# Patient Record
Sex: Male | Born: 2005 | Race: White | Hispanic: No | Marital: Single | Smoking: Never smoker
Health system: Southern US, Community
[De-identification: ages and names within clinical notes are randomized; demographics above are authoritative.]

## PROBLEM LIST (undated history)

## (undated) DIAGNOSIS — J45909 Unspecified asthma, uncomplicated: Secondary | ICD-10-CM

---
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
## 2023-01-07 ENCOUNTER — Telehealth: Payer: Self-pay | Admitting: Family Medicine

## 2023-01-07 NOTE — Telephone Encounter (Signed)
   CLINICAL USE BELOW THIS LINE (use X to signify action taken)  _X__ Form received and placed in providers office for signature. ___ Form completed and faxed to LOA Dept.  ___ Form completed & LVM to notify patient ready for pick up.  ___ Charge sheet and copy of form in front office folder for office supervisor.  

## 2023-01-07 NOTE — Telephone Encounter (Signed)
Type of forms received:HIPAA,HEALTH CARE POA,LIVING WELL  Routed to: [employer] received by : terrill   Individual made aware of 3-5 business day turn around (Y/N): Yes  Form completed and patient made aware of charges(Y/N): yes  Faxed to :   Form location:  dr Grandville Silos boc

## 2023-01-08 NOTE — Telephone Encounter (Signed)
Placed in scan folder.

## 2023-01-14 ENCOUNTER — Other Ambulatory Visit: Payer: Self-pay | Admitting: Family Medicine

## 2023-01-14 DIAGNOSIS — J4520 Mild intermittent asthma, uncomplicated: Secondary | ICD-10-CM

## 2023-01-14 NOTE — Telephone Encounter (Signed)
Chart supports rx. Last OV: 06/07/2022

## 2023-02-05 ENCOUNTER — Ambulatory Visit (INDEPENDENT_AMBULATORY_CARE_PROVIDER_SITE_OTHER): Payer: BC Managed Care – PPO | Admitting: Family Medicine

## 2023-02-05 ENCOUNTER — Encounter: Payer: Self-pay | Admitting: Family Medicine

## 2023-02-05 VITALS — BP 122/76 | HR 70 | Temp 97.6°F | Ht 70.0 in | Wt 133.0 lb

## 2023-02-05 DIAGNOSIS — Z8249 Family history of ischemic heart disease and other diseases of the circulatory system: Secondary | ICD-10-CM

## 2023-02-05 DIAGNOSIS — J4520 Mild intermittent asthma, uncomplicated: Secondary | ICD-10-CM

## 2023-02-05 DIAGNOSIS — Z130 Encounter for screening for diseases of the blood and blood-forming organs and certain disorders involving the immune mechanism: Secondary | ICD-10-CM

## 2023-02-05 DIAGNOSIS — Z0000 Encounter for general adult medical examination without abnormal findings: Secondary | ICD-10-CM

## 2023-02-05 DIAGNOSIS — Z1321 Encounter for screening for nutritional disorder: Secondary | ICD-10-CM

## 2023-02-05 DIAGNOSIS — Z025 Encounter for examination for participation in sport: Secondary | ICD-10-CM | POA: Diagnosis not present

## 2023-02-05 DIAGNOSIS — Z13228 Encounter for screening for other metabolic disorders: Secondary | ICD-10-CM

## 2023-02-05 DIAGNOSIS — I517 Cardiomegaly: Secondary | ICD-10-CM

## 2023-02-05 DIAGNOSIS — Z13220 Encounter for screening for lipoid disorders: Secondary | ICD-10-CM | POA: Diagnosis not present

## 2023-02-05 DIAGNOSIS — Z23 Encounter for immunization: Secondary | ICD-10-CM | POA: Diagnosis not present

## 2023-02-05 DIAGNOSIS — Z1159 Encounter for screening for other viral diseases: Secondary | ICD-10-CM

## 2023-02-05 DIAGNOSIS — Z1329 Encounter for screening for other suspected endocrine disorder: Secondary | ICD-10-CM

## 2023-02-05 LAB — BASIC METABOLIC PANEL
BUN: 12 mg/dL (ref 6–23)
CO2: 30 mEq/L (ref 19–32)
Calcium: 9.7 mg/dL (ref 8.4–10.5)
Chloride: 103 mEq/L (ref 96–112)
Creatinine, Ser: 0.92 mg/dL (ref 0.40–1.50)
GFR: 121.65 mL/min (ref >60.00)
Glucose, Bld: 83 mg/dL (ref 70–99)
Potassium: 4.2 mEq/L (ref 3.5–5.1)
Sodium: 138 mEq/L (ref 135–145)

## 2023-02-05 NOTE — Assessment & Plan Note (Signed)
Patient has a history of asthma, reporting infrequent episodes requiring albuterol inhaler usage.  Plan:  Continue albuterol inhaler as needed. Consider an updated asthma action plan. Encourage avoidance of known triggers such as dust. Review inhaler technique to ensure efficacy.

## 2023-02-05 NOTE — Patient Instructions (Addendum)
Your EKG was normal.   You may play baseball without restrictions. Please come back if you develop chest pain, shortness of breath, or other symptoms. If severe, go to ED.

## 2023-02-05 NOTE — Assessment & Plan Note (Signed)
Family history notable for father's heart attack near the age of 29 raises concern for potential increased cardiac risk.  Plan:  Perform baseline EKG to assess for any cardiac abnormalities.  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 Obtain lipid profile to screen for dyslipidemia. Follow-up on lab results and consider cardiology referral if abnormalities detected. Counseled provided on return and ED for symptoms including chest pain, shortness of breath, or other abnormal symptoms.

## 2023-02-05 NOTE — Progress Notes (Signed)
Assessment  Assessment/Plan:   Problem List Items Addressed This Visit       Cardiovascular and Mediastinum   Athletic heart syndrome     Respiratory   Mild intermittent asthma    Patient has a history of asthma, reporting infrequent episodes requiring albuterol inhaler usage.  Plan:  Continue albuterol inhaler as needed. Consider an updated asthma action plan. Encourage avoidance of known triggers such as dust. Review inhaler technique to ensure efficacy.        Other   Screening for viral disease   Relevant Orders   Hepatitis C Antibody   HIV antibody (with reflex)   Family history of heart disease - Primary    Family history notable for father's heart attack near the age of 21 raises concern for potential increased cardiac risk.  Plan:  Perform baseline EKG to assess for any cardiac abnormalities.  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 Obtain lipid profile to screen for dyslipidemia. Follow-up on lab results and consider cardiology referral if abnormalities detected. Counseled provided on return and ED for symptoms including chest pain, shortness of breath, or other abnormal symptoms.      Relevant Orders   EKG 12-Lead (Completed)   Basic Metabolic Panel (BMET)   Routine sports physical exam    Cleared to play sports given normal EKG.  Form provided patient      Relevant Orders   EKG 12-Lead (Completed)   Basic Metabolic Panel (BMET)   Other Visit Diagnoses     Encounter for well adult exam without abnormal findings       Relevant Orders   EKG 12-Lead (Completed)   Basic Metabolic Panel (BMET)   Screening, lipid       Screening for endocrine, nutritional, metabolic and immunity disorder       Relevant Orders   Basic Metabolic Panel (BMET)   Need for Tdap vaccination       Relevant Orders   Tdap vaccine greater than or equal to 7yo IM (Completed)   Need for vaccination against human papillomavirus       Relevant Orders    HPV 9-valent vaccine,Recombinat (Completed)       There are no discontinued medications.  Patient Counseling(The following topics were reviewed and/or handout was given):  -Nutrition: Stressed importance of moderation in sodium/caffeine intake, saturated fat and cholesterol, caloric balance, sufficient intake of fresh fruits, vegetables, and fiber.  -Stressed the importance of regular exercise.   -Substance Abuse: Discussed cessation/primary prevention of tobacco, alcohol, or other drug use; driving or other dangerous activities under the influence; availability of treatment for abuse.   -Injury prevention: Discussed safety belts, safety helmets, smoke detector, smoking near bedding or upholstery.   -Sexuality: Discussed sexually transmitted diseases, partner selection, use of condoms, avoidance of unintended pregnancy and contraceptive alternatives.   -Dental health: Discussed importance of regular tooth brushing, flossing, and dental visits.  -Health maintenance and immunizations reviewed. Please refer to Health maintenance section.  Return to care in 1 year for next preventative visit.       Subjective:  Chief complaint Encounter date: 02/05/2023  Chief Complaint  Patient presents with   Annual Exam   Shawn Salas is a 17 y.o. male who presents today for his annual comprehensive physical exam.    Patient is a 17 y.o. year old male here for sports physical.  CHIEF COMPLAINT: 17 year old male presents for a sports physical, no current complaints.  HISTORY OF PRESENT ILLNESS:  Sports  Physical. Patient plans to play baseball as shortstop. Denies chest pain, shortness of breath, syncope, or passing out with exercise.  No medical problems.  Patient has a family history of father who is had nonfatal heart attack around the age of 39.    Cough. The patient reports occasional cough which may be associated with a known asthma condition, triggered by dust or indoor exercise. He  mentions usage of albuterol inhaler once every month or two months.  Symptoms are well-controlled with this.  ROS:  Cardiology: No chest pain or shortness of breath. Pulmonary: Infrequent use of albuterol inhaler for asthma; no other respiratory complaints. Neurological: No syncope or neurological deficits noted. Remainder of ROS negative.    Past Medical History:  Diagnosis Date   Asthma     Current Outpatient Medications on File Prior to Visit  Medication Sig Dispense Refill   albuterol (VENTOLIN HFA) 108 (90 Base) MCG/ACT inhaler INHALE 1-2 PUFFS BY MOUTH EVERY 6 HOURS AS NEEDED FOR WHEEZE OR SHORTNESS OF BREATH 18 each 0   No current facility-administered medications on file prior to visit.    Past Surgical History:  Procedure Laterality Date   FINGER SURGERY  2020    Allergies  Allergen Reactions   Peanut-Containing Drug Products Rash    Social History   Socioeconomic History   Marital status: Single    Spouse name: Not on file   Number of children: Not on file   Years of education: Not on file   Highest education level: Not on file  Occupational History   Not on file  Tobacco Use   Smoking status: Never   Smokeless tobacco: Not on file  Vaping Use   Vaping Use: Never used  Substance and Sexual Activity   Alcohol use: No   Drug use: No   Sexual activity: Not on file  Other Topics Concern   Not on file  Social History Narrative   Not on file   Social Determinants of Health   Financial Resource Strain: Not on file  Food Insecurity: Not on file  Transportation Needs: Not on file  Physical Activity: Not on file  Stress: Not on file  Social Connections: Not on file  Intimate Partner Violence: Not on file    Family History  Problem Relation Age of Onset   Heart attack Father    Diabetes Paternal Grandmother     BP 122/76 (BP Location: Right Arm, Patient Position: Sitting, Cuff Size: Large)   Pulse 70   Temp 97.6 F (36.4 C) (Temporal)   Ht  '5\' 10"'$  (1.778 m)   Wt 133 lb (60.3 kg)   SpO2 97%   BMI 19.08 kg/m      06/07/2022    9:43 AM  GAD-7 Generalized Anxiety Disorder Screening Tool  1. Feeling Nervous, Anxious, or on Edge 0  2. Not Being Able to Stop or Control Worrying 0  3. Worrying Too Much About Different Things 0  4. Trouble Relaxing 0  5. Being So Restless it's Hard To Sit Still 0  6. Becoming Easily Annoyed or Irritable 0  7. Feeling Afraid As If Something Awful Might Happen 0  Total GAD-7 Score 0  Difficulty At Work, Home, or Getting  Along With Others? Not difficult at all      06/07/2022    9:43 AM  Depression screen PHQ 2/9  Decreased Interest 0  Down, Depressed, Hopeless 0  PHQ - 2 Score 0  Altered sleeping 0  Tired, decreased  energy 0  Change in appetite 0  Feeling bad or failure about yourself  3  Trouble concentrating 0  Moving slowly or fidgety/restless 0  Suicidal thoughts 0  PHQ-9 Score 3  Difficult doing work/chores Not difficult at all    Health Maintenance Due  Topic Date Due   HIV Screening  Never done   INFLUENZA VACCINE  07/10/2022   COVID-19 Vaccine (4 - 2023-24 season) 08/10/2022   Hepatitis C Screening  Never done   HPV VACCINES (2 - Male 3-dose series) 03/05/2023     PMH:  The following were reviewed and entered/updated in epic: Past Medical History:  Diagnosis Date   Asthma     Patient Active Problem List   Diagnosis Date Noted   Athletic heart syndrome 02/05/2023   Screening for viral disease 02/05/2023   Family history of heart disease 02/05/2023   Routine sports physical exam 02/05/2023   Peanut allergy 06/07/2022   Lactose intolerance 06/07/2022   Mild intermittent asthma 06/07/2022   Seasonal allergies 06/07/2022   History of nicotine vaping 06/07/2022    Past Surgical History:  Procedure Laterality Date   FINGER SURGERY  2020    Family History  Problem Relation Age of Onset   Heart attack Father    Diabetes Paternal Grandmother      Medications- reviewed and updated Outpatient Medications Prior to Visit  Medication Sig Dispense Refill   albuterol (VENTOLIN HFA) 108 (90 Base) MCG/ACT inhaler INHALE 1-2 PUFFS BY MOUTH EVERY 6 HOURS AS NEEDED FOR WHEEZE OR SHORTNESS OF BREATH 18 each 0   No facility-administered medications prior to visit.    Allergies  Allergen Reactions   Peanut-Containing Drug Products Rash    Social History   Socioeconomic History   Marital status: Single    Spouse name: Not on file   Number of children: Not on file   Years of education: Not on file   Highest education level: Not on file  Occupational History   Not on file  Tobacco Use   Smoking status: Never   Smokeless tobacco: Not on file  Vaping Use   Vaping Use: Never used  Substance and Sexual Activity   Alcohol use: No   Drug use: No   Sexual activity: Not on file  Other Topics Concern   Not on file  Social History Narrative   Not on file   Social Determinants of Health   Financial Resource Strain: Not on file  Food Insecurity: Not on file  Transportation Needs: Not on file  Physical Activity: Not on file  Stress: Not on file  Social Connections: Not on file        Objective:  Physical Exam: BP 122/76 (BP Location: Right Arm, Patient Position: Sitting, Cuff Size: Large)   Pulse 70   Temp 97.6 F (36.4 C) (Temporal)   Ht '5\' 10"'$  (1.778 m)   Wt 133 lb (60.3 kg)   SpO2 97%   BMI 19.08 kg/m   Body mass index is 19.08 kg/m. Wt Readings from Last 3 Encounters:  02/05/23 133 lb (60.3 kg) (22 %, Z= -0.77)*  06/07/22 131 lb (59.4 kg) (24 %, Z= -0.71)*  07/19/19 125 lb 6 oz (56.9 kg) (58 %, Z= 0.20)*   * Growth percentiles are based on CDC (Boys, 2-20 Years) data.   Vision Screening   Right eye Left eye Both eyes  Without correction '20/20 20/20 20/20 '$  With correction      Physical Exam Constitutional:  Appearance: Normal appearance. He is normal weight.     Comments: Non-marfanoid  HENT:      Head: Normocephalic and atraumatic.     Nose: Nose normal.     Mouth/Throat:     Mouth: Mucous membranes are moist.     Pharynx: Oropharynx is clear.  Eyes:     Extraocular Movements: Extraocular movements intact.     Conjunctiva/sclera: Conjunctivae normal.     Pupils: Pupils are equal, round, and reactive to light.  Cardiovascular:     Rate and Rhythm: Regular rhythm. Bradycardia present.     Heart sounds: Normal heart sounds. No murmur heard.    No gallop.  Pulmonary:     Effort: Pulmonary effort is normal.     Breath sounds: Normal breath sounds. No wheezing.  Abdominal:     General: Abdomen is flat.     Palpations: Abdomen is soft.  Musculoskeletal:        General: Normal range of motion.     Right shoulder: Normal.     Left shoulder: Normal.     Right upper arm: Normal.     Left upper arm: Normal.     Right elbow: Normal.     Left elbow: Normal.     Right forearm: Normal.     Left forearm: Normal.     Right wrist: Normal.     Left wrist: Normal.     Right hand: Normal.     Left hand: Normal.     Cervical back: Normal, normal range of motion and neck supple.     Thoracic back: Normal.     Lumbar back: Normal.     Right upper leg: Normal.     Left upper leg: Normal.     Right knee: Normal.     Left knee: Normal.     Right lower leg: Normal.     Left lower leg: Normal.     Right ankle: Normal.     Right Achilles Tendon: Normal.     Left ankle: Normal.     Left Achilles Tendon: Normal.     Right foot: Normal.     Left foot: Normal.  Skin:    General: Skin is warm and dry.  Neurological:     Mental Status: He is alert.     Cranial Nerves: No cranial nerve deficit.     Sensory: No sensory deficit.     Motor: Motor function is intact.     Gait: Gait normal.     Deep Tendon Reflexes: Reflexes normal.     Reflex Scores:      Patellar reflexes are 2+ on the right side and 2+ on the left side. Psychiatric:        Mood and Affect: Mood normal.        Behavior:  Behavior normal.        Thought Content: Thought content normal.        Judgment: Judgment normal.         At today's visit, we discussed treatment options, associated risk and benefits, and engage in counseling as needed.  Additionally the following were reviewed: Past medical records, past medical and surgical history, family and social background, as well as relevant laboratory results, imaging findings, and specialty notes, where applicable.  This message was generated using dictation software, and as a result, it may contain unintentional typos or errors.  Nevertheless, extensive effort was made to accurately convey at the pertinent aspects of the patient visit.  There may have been are other unrelated non-urgent complaints, but due to the busy schedule and the amount of time already spent with him, time does not permit to address these issues at today's visit. Another appointment may have or has been requested to review these additional issues.   Marny Lowenstein, MD, MS

## 2023-02-05 NOTE — Assessment & Plan Note (Signed)
Cleared to play sports given normal EKG.  Form provided patient

## 2023-02-06 LAB — HEPATITIS C ANTIBODY: Hepatitis C Ab: NONREACTIVE

## 2023-02-06 LAB — HIV ANTIBODY (ROUTINE TESTING W REFLEX): HIV 1&2 Ab, 4th Generation: NONREACTIVE

## 2023-03-04 NOTE — Progress Notes (Signed)
After obtaining consent, and per orders of Dr. Grandville Silos, injection of HPV given IM in the LT deltoid and Bexsero given IM in the Rt deltoid by Langley Gauss. M, CMA. Patient instructed to remain in clinic for 20 minutes afterwards, and to report any adverse reaction to me immediately. Copy of immunization report given to patient.

## 2023-03-07 ENCOUNTER — Ambulatory Visit: Payer: BC Managed Care – PPO

## 2023-03-07 DIAGNOSIS — Z23 Encounter for immunization: Secondary | ICD-10-CM

## 2023-05-27 ENCOUNTER — Other Ambulatory Visit: Payer: Self-pay | Admitting: Family Medicine

## 2023-05-27 DIAGNOSIS — J4520 Mild intermittent asthma, uncomplicated: Secondary | ICD-10-CM

## 2023-06-27 ENCOUNTER — Other Ambulatory Visit: Payer: Self-pay | Admitting: Family Medicine

## 2023-06-27 DIAGNOSIS — J4520 Mild intermittent asthma, uncomplicated: Secondary | ICD-10-CM

## 2023-07-11 ENCOUNTER — Other Ambulatory Visit: Payer: Self-pay | Admitting: Family Medicine

## 2023-07-11 ENCOUNTER — Telehealth: Payer: Self-pay | Admitting: Family Medicine

## 2023-07-11 DIAGNOSIS — J4520 Mild intermittent asthma, uncomplicated: Secondary | ICD-10-CM

## 2023-07-11 MED ORDER — ALBUTEROL SULFATE HFA 108 (90 BASE) MCG/ACT IN AERS: 1.0000 | INHALATION_SPRAY | Freq: Four times a day (QID) | RESPIRATORY_TRACT | 0 refills | Status: DC | PRN

## 2023-07-11 NOTE — Telephone Encounter (Signed)
Pt has used his inhaler more than usual due to work outside. The pharmacy told him it was denied. He needs a refill. Please advise.

## 2023-07-11 NOTE — Telephone Encounter (Signed)
Requesting: Albuterol Inh 108 MCG Last Visit: 02/05/2023 Next Visit: Visit date not found Last Refill: 06/27/2023  Please Advise

## 2023-07-11 NOTE — Addendum Note (Signed)
Addended by: Andrez Grime on: 07/11/2023 03:12 PM   Modules accepted: Orders

## 2023-07-12 NOTE — Telephone Encounter (Signed)
Patient refill for Albuterol was refilled and sent to his pharmacy. Patient is aware.

## 2023-07-16 ENCOUNTER — Other Ambulatory Visit: Payer: Self-pay | Admitting: Family Medicine

## 2023-07-16 DIAGNOSIS — J4520 Mild intermittent asthma, uncomplicated: Secondary | ICD-10-CM

## 2023-08-15 ENCOUNTER — Emergency Department (HOSPITAL_BASED_OUTPATIENT_CLINIC_OR_DEPARTMENT_OTHER)
Admission: EM | Admit: 2023-08-15 | Discharge: 2023-08-15 | Disposition: A | Discharge status: Home / Self Care | Payer: BC Managed Care – PPO | Attending: Emergency Medicine | Admitting: Emergency Medicine

## 2023-08-15 ENCOUNTER — Emergency Department (HOSPITAL_BASED_OUTPATIENT_CLINIC_OR_DEPARTMENT_OTHER): Payer: BC Managed Care – PPO

## 2023-08-15 ENCOUNTER — Encounter (HOSPITAL_BASED_OUTPATIENT_CLINIC_OR_DEPARTMENT_OTHER): Payer: Self-pay

## 2023-08-15 ENCOUNTER — Other Ambulatory Visit: Payer: Self-pay

## 2023-08-15 DIAGNOSIS — S63257A Unspecified dislocation of left little finger, initial encounter: Secondary | ICD-10-CM | POA: Insufficient documentation

## 2023-08-15 DIAGNOSIS — X58XXXA Exposure to other specified factors, initial encounter: Secondary | ICD-10-CM | POA: Diagnosis not present

## 2023-08-15 DIAGNOSIS — Z91010 Allergy to peanuts: Secondary | ICD-10-CM | POA: Diagnosis not present

## 2023-08-15 DIAGNOSIS — S62647A Nondisplaced fracture of proximal phalanx of left little finger, initial encounter for closed fracture: Secondary | ICD-10-CM | POA: Insufficient documentation

## 2023-08-15 DIAGNOSIS — S62607A Fracture of unspecified phalanx of left little finger, initial encounter for closed fracture: Secondary | ICD-10-CM

## 2023-08-15 DIAGNOSIS — Y9367 Activity, basketball: Secondary | ICD-10-CM | POA: Insufficient documentation

## 2023-08-15 DIAGNOSIS — S63259A Unspecified dislocation of unspecified finger, initial encounter: Secondary | ICD-10-CM

## 2023-08-15 DIAGNOSIS — S6992XA Unspecified injury of left wrist, hand and finger(s), initial encounter: Secondary | ICD-10-CM | POA: Diagnosis present

## 2023-08-15 MED ORDER — ACETAMINOPHEN 325 MG PO TABS
650.0000 mg | ORAL_TABLET | Freq: Once | ORAL | Status: AC
  Administered 2023-08-15: 650 mg via ORAL
  Filled 2023-08-15: qty 2

## 2023-08-15 MED ORDER — OXYCODONE-ACETAMINOPHEN 5-325 MG PO TABS
1.0000 | ORAL_TABLET | Freq: Four times a day (QID) | ORAL | Status: DC | PRN
  Administered 2023-08-15: 1 via ORAL
  Filled 2023-08-15: qty 1

## 2023-08-15 NOTE — ED Triage Notes (Signed)
Pt has either fractured or dislocated his left pinky finger while playing basketball. Pt did take Advil PTA

## 2023-08-15 NOTE — Discharge Instructions (Addendum)
Keep finger in splint but okay to take off to shower.  Overall you have a dislocation and fracture of your pinky finger.  Finger is no longer dislocated but there is a small fracture.  Follow-up with hand doctor for further care.  Recommend Tylenol, ibuprofen and ice for pain.  Do not play basketball any sports or do any vigorous activity until you are cleared by doctor.

## 2023-08-15 NOTE — ED Provider Notes (Signed)
[place] [facility] Provider Note   CSN: [id number] Arrival date & time: 08/15/23  2101     History  Chief Complaint  Patient presents with   Finger Injury    Shawn Salas is a 17 y.o. male.  Patient here with injury to the left pinky finger.  He was playing basketball injured the finger.  Deformity.  Nothing makes it worse or better.  Denies any weakness numbness tingling.  No other injury elsewhere.  The history is provided by the patient.       Home Medications Prior to Admission medications   Medication Sig Start Date End Date Taking? Authorizing Provider  albuterol (VENTOLIN HFA) 108 (90 Base) MCG/ACT inhaler Inhale 1-2 puffs into the lungs every 6 (six) hours as needed for wheezing or shortness of breath. 07/11/23   Mliss Sax, MD      Allergies    Peanut-containing drug products    Review of Systems   Review of Systems  Physical Exam Updated Vital Signs BP 123/80   Pulse 67   Temp 98.6 F (37 C)   Resp 20   Ht 5\' 11"  (1.803 m)   Wt 61.2 kg   SpO2 100%   BMI 18.83 kg/m  Physical Exam Constitutional:      General: He is not in acute distress.    Appearance: He is not ill-appearing.  Cardiovascular:     Pulses: Normal pulses.     Heart sounds: Normal heart sounds.  Musculoskeletal:        General: Swelling, tenderness and deformity present.     Cervical back: Normal range of motion.     Comments: Deformity to the left pinky finger, suspect dislocation  Skin:    General: Skin is warm.     Capillary Refill: Capillary refill takes less than 2 seconds.  Neurological:     General: No focal deficit present.     Mental Status: He is alert.     Sensory: No sensory deficit.     Motor: No weakness.     ED Results / Procedures / Treatments   Labs (all labs ordered are listed, but only abnormal results are displayed) Labs Reviewed - No data to display  EKG None  Radiology DG Finger Little  Left  Result Date: 08/15/2023 CLINICAL DATA:  Post reduction EXAM: LEFT FINGER(S) - 2+ VIEW COMPARISON:  08/15/2023 FINDINGS: Reduction of previously noted fifth PIP joint dislocation, now with normal alignment. Acute nondisplaced intra-articular fracture involving the base of the fifth middle phalanx. Positive for soft tissue swelling IMPRESSION: Reduction of previously noted fifth PIP joint dislocation. Acute nondisplaced intra-articular fracture involving the base of the fifth middle phalanx. Electronically Signed   By: Jasmine Pang M.D.   On: 08/15/2023 22:19   DG Finger Little Left  Result Date: 08/15/2023 CLINICAL DATA:  Basketball injury EXAM: LEFT FINGER(S) - 2+ VIEW COMPARISON:  None Available. FINDINGS: Dislocation at the fifth PIP joint with middle phalanx displaced dorsal and ulnar with respect to the head of the proximal phalanx. Possible tiny fracture fragment adjacent to the head of the fifth proximal phalanx and base of the middle phalanx. IMPRESSION: Dislocation at the fifth PIP joint with possible tiny fracture fragments adjacent to the head of fifth proximal phalanx and base of the middle phalanx Electronically Signed   By: Jasmine Pang M.D.   On: 08/15/2023 22:18    Procedures .Ortho Injury Treatment  Date/Time: 08/15/2023 10:37 PM  Performed  by: Virgina Norfolk, DO Authorized by: Virgina Norfolk, DO   Consent:    Consent obtained:  Verbal   Consent given by:  Patient   Risks discussed:  Fracture, irreducible dislocation, nerve damage, recurrent dislocation, vascular damage, restricted joint movement and stiffness   Alternatives discussed:  No treatmentInjury location: left pink finger dislocation. Pre-procedure neurovascular assessment: neurovascularly intact Pre-procedure distal perfusion: normal Pre-procedure neurological function: normal Pre-procedure range of motion: reduced  Anesthesia: Local anesthesia used: no Immobilization: splint Splint type: static  finger Splint Applied by: Milon Dikes Post-procedure neurovascular assessment: post-procedure neurovascularly intact Post-procedure distal perfusion: normal Post-procedure neurological function: normal Post-procedure range of motion: normal       Medications Ordered in ED Medications  oxyCODONE-acetaminophen (PERCOCET/ROXICET) 5-325 MG per tablet 1 tablet (1 tablet Oral Given 08/15/23 2139)  acetaminophen (TYLENOL) tablet 650 mg (650 mg Oral Given 08/15/23 2239)    ED Course/ Medical Decision Making/ A&P                                 Medical Decision Making Amount and/or Complexity of Data Reviewed Radiology: ordered.  Risk OTC drugs. Prescription drug management.   Shawn Salas is here with left pinky injury while playing basketball.  Neurovascular neuromuscular intact.  No significant medical problems.  Normal vitals.  X-ray shows dislocation of the fifth PIP joint with possible tiny fracture fragments adjacent to the head of the proximal phalanx and the base of the middle phalanx.  Patient is given Norco and I was able to reduce dislocation manually without any issues.  Repeat x-ray does show reduction of dislocation and does confirm acute nondisplaced intra-articular fracture involving the base of the fifth middle phalanx.  Patient was placed in a static splint.  Recommend Tylenol, ibuprofen, ice.  Will have him follow-up with hand.  Discharged in good condition.  This chart was dictated using voice recognition software.  Despite best efforts to proofread,  errors can occur which can change the documentation meaning.         Final Clinical Impression(s) / ED Diagnoses Final diagnoses:  Closed nondisplaced fracture of phalanx of left little finger, unspecified phalanx, initial encounter  Dislocation of finger, initial encounter    Rx / DC Orders ED Discharge Orders     None         Virgina Norfolk, DO 08/15/23 2240

## 2023-09-09 ENCOUNTER — Telehealth: Payer: Self-pay | Admitting: Family Medicine

## 2023-09-09 DIAGNOSIS — J4520 Mild intermittent asthma, uncomplicated: Secondary | ICD-10-CM

## 2023-09-09 NOTE — Telephone Encounter (Signed)
Pt mom called and said the pt medication for now on / as of today need to go to [facility] [id number] [phone] please and thank you

## 2023-09-09 NOTE — Telephone Encounter (Signed)
Pharmacy updated in chart to [facility] [address] [place], [place], [place] [address] P: [phone].

## 2023-09-24 NOTE — Telephone Encounter (Signed)
Pt's mom, Victorino Dike back needing his script sent to [facility] Address: [address] [place], [place], [place] [address] Phone: [phone]

## 2023-09-24 NOTE — Telephone Encounter (Signed)
Left patient's mom a detailed voice message to return call to office with what medication is needing refill.    Did she state if it was his albuterol inhaler that's needing a refill?

## 2023-09-25 MED ORDER — ALBUTEROL SULFATE HFA 108 (90 BASE) MCG/ACT IN AERS: 1.0000 | INHALATION_SPRAY | Freq: Four times a day (QID) | RESPIRATORY_TRACT | 0 refills | Status: DC | PRN

## 2023-09-25 NOTE — Telephone Encounter (Signed)
Rx sent in to pharmacy. 

## 2023-09-25 NOTE — Addendum Note (Signed)
Addended by: Trudee Kuster on: 09/25/2023 08:05 AM   Modules accepted: Orders

## 2023-10-03 ENCOUNTER — Telehealth: Payer: Self-pay | Admitting: Family Medicine

## 2023-10-03 NOTE — Telephone Encounter (Signed)
Pt mom called and want to know if she can get confirmation and date  for his HPV vaccines . Please give the mom a call

## 2023-10-03 NOTE — Telephone Encounter (Signed)
Provided mom with HPV vaccine dates 02/05/2023 and 03/07/2023

## 2023-10-14 ENCOUNTER — Encounter: Payer: Self-pay | Admitting: Family Medicine

## 2023-10-14 ENCOUNTER — Ambulatory Visit: Payer: BC Managed Care – PPO | Admitting: Family Medicine

## 2023-10-14 VITALS — BP 126/74 | HR 55 | Temp 97.1°F | Wt 137.0 lb

## 2023-10-14 DIAGNOSIS — Z202 Contact with and (suspected) exposure to infections with a predominantly sexual mode of transmission: Secondary | ICD-10-CM

## 2023-10-14 DIAGNOSIS — A545 Gonococcal pharyngitis: Secondary | ICD-10-CM

## 2023-10-14 DIAGNOSIS — Z23 Encounter for immunization: Secondary | ICD-10-CM

## 2023-10-14 NOTE — Progress Notes (Signed)
Assessment/Plan:   Problem List Items Addressed This Visit       Other   Encounter for assessment of sexually transmitted disease exposure - Primary    Perform throat swab for gonorrhea and chlamydia. Collect urine sample for gonorrhea, chlamydia, mycoplasma, and trichomonas. Blood tests for HIV, hepatitis B, hepatitis C, and syphilis. Administer HPV vaccine (3rd dose) Educate patient on the use of barrier protection methods to prevent STDs, including condom for genital sex and dental dams for oral sex.      Relevant Orders   Urinalysis w microscopic + reflex cultur   Chlamydia/GC NAA, Confirmation   HCV Ab w Reflex to Quant PCR   Hepatitis B surface antigen   HIV Antibody (routine testing w rflx)   RPR   Genital Mycoplasmas NAA, Urine   C. trachomatis/N. gonorrhoeae RNA   Hepatitis B core antibody, total   Hepatitis B surface antibody,qualitative   Other Visit Diagnoses     Immunization due       Relevant Orders   Flu vaccine trivalent PF, 6mos and older(Flulaval,Afluria,Fluarix,Fluzone) (Completed)   HPV 9-valent vaccine,Recombinat (Completed)       There are no discontinued medications.  No follow-ups on file.    Subjective:   Encounter date: 10/14/2023  Shawn Salas is a 17 y.o. male who has Peanut allergy; Lactose intolerance; Mild intermittent asthma; Seasonal allergies; History of nicotine vaping; Athletic heart syndrome; Screening for viral disease; Family history of heart disease; Routine sports physical exam; and Encounter for assessment of sexually transmitted disease exposure on their problem list..   He  has a past medical history of Asthma..   Chief Complaint: STD screening  History of Present Illness:  Patient presented for STD screening, expressing specific concern about potential exposure to gonorrhea 1 day ago. The patient reported using a condom during the exposure but was concerned about the risk of transmission via oral sex. No  reported symptoms such as genital sores, burning with urination, blood in urine, or throat pain. The patient engages in sexual activities with women.    Past Surgical History:  Procedure Laterality Date   FINGER SURGERY  2020    Outpatient Medications Prior to Visit  Medication Sig Dispense Refill   albuterol (VENTOLIN HFA) 108 (90 Base) MCG/ACT inhaler Inhale 1-2 puffs into the lungs every 6 (six) hours as needed for wheezing or shortness of breath. 153 g 0   No facility-administered medications prior to visit.    Family History  Problem Relation Age of Onset   Heart attack Father    Diabetes Paternal Grandmother     Social History   Socioeconomic History   Marital status: Single    Spouse name: Not on file   Number of children: Not on file   Years of education: Not on file   Highest education level: Not on file  Occupational History   Not on file  Tobacco Use   Smoking status: Never   Smokeless tobacco: Not on file  Vaping Use   Vaping status: Never Used  Substance and Sexual Activity   Alcohol use: No   Drug use: No   Sexual activity: Not on file  Other Topics Concern   Not on file  Social History Narrative   Not on file   Social Determinants of Health   Financial Resource Strain: Not on file  Food Insecurity: Not on file  Transportation Needs: Not on file  Physical Activity: Not on file  Stress: Not on file  Social Connections: Not on file  Intimate Partner Violence: Not on file                                                                                                  Objective:  Physical Exam: BP 126/74 (BP Location: Left Arm, Patient Position: Sitting, Cuff Size: Large)   Pulse (!) 55   Temp (!) 97.1 F (36.2 C) (Temporal)   Wt 137 lb (62.1 kg)   SpO2 99%   BMI 19.11 kg/m     Physical Exam Constitutional:      Appearance: Normal appearance.  HENT:     Head: Normocephalic and atraumatic.     Right Ear: Hearing normal.     Left  Ear: Hearing normal.     Nose: Nose normal.  Eyes:     General: No scleral icterus.       Right eye: No discharge.        Left eye: No discharge.     Extraocular Movements: Extraocular movements intact.  Cardiovascular:     Rate and Rhythm: Normal rate and regular rhythm.     Heart sounds: Normal heart sounds.  Pulmonary:     Effort: Pulmonary effort is normal.     Breath sounds: Normal breath sounds.  Abdominal:     Palpations: Abdomen is soft.     Tenderness: There is no abdominal tenderness.  Genitourinary:    Comments: deferred Skin:    General: Skin is warm.     Findings: No rash.  Neurological:     General: No focal deficit present.     Mental Status: He is alert.     Cranial Nerves: No cranial nerve deficit.  Psychiatric:        Mood and Affect: Mood normal.        Behavior: Behavior normal.        Thought Content: Thought content normal.        Judgment: Judgment normal.     DG Finger Little Left  Result Date: 08/15/2023 CLINICAL DATA:  Post reduction EXAM: LEFT FINGER(S) - 2+ VIEW COMPARISON:  08/15/2023 FINDINGS: Reduction of previously noted fifth PIP joint dislocation, now with normal alignment. Acute nondisplaced intra-articular fracture involving the base of the fifth middle phalanx. Positive for soft tissue swelling IMPRESSION: Reduction of previously noted fifth PIP joint dislocation. Acute nondisplaced intra-articular fracture involving the base of the fifth middle phalanx. Electronically Signed   By: Jasmine Pang M.D.   On: 08/15/2023 22:19   DG Finger Little Left  Result Date: 08/15/2023 CLINICAL DATA:  Basketball injury EXAM: LEFT FINGER(S) - 2+ VIEW COMPARISON:  None Available. FINDINGS: Dislocation at the fifth PIP joint with middle phalanx displaced dorsal and ulnar with respect to the head of the proximal phalanx. Possible tiny fracture fragment adjacent to the head of the fifth proximal phalanx and base of the middle phalanx. IMPRESSION: Dislocation at  the fifth PIP joint with possible tiny fracture fragments adjacent to the head of fifth proximal phalanx and base of the middle phalanx Electronically Signed   By: Adrian Prows.D.  On: 08/15/2023 22:18    No results found for this or any previous visit (from the past 2160 hour(s)).      Garner Nash, MD, MS

## 2023-10-14 NOTE — Assessment & Plan Note (Signed)
Perform throat swab for gonorrhea and chlamydia. Collect urine sample for gonorrhea, chlamydia, mycoplasma, and trichomonas. Blood tests for HIV, hepatitis B, hepatitis C, and syphilis. Administer HPV vaccine (3rd dose) Educate patient on the use of barrier protection methods to prevent STDs, including condom for genital sex and dental dams for oral sex.

## 2023-10-15 LAB — URINALYSIS W MICROSCOPIC + REFLEX CULTURE
Bacteria, UA: NONE SEEN /[HPF]
Bilirubin Urine: NEGATIVE
Glucose, UA: NEGATIVE
Hgb urine dipstick: NEGATIVE
Hyaline Cast: NONE SEEN /[LPF]
Ketones, ur: NEGATIVE
Leukocyte Esterase: NEGATIVE
Nitrites, Initial: NEGATIVE
Protein, ur: NEGATIVE
RBC / HPF: NONE SEEN /[HPF] (ref 0–2)
Specific Gravity, Urine: 1.02 (ref 1.001–1.035)
Squamous Epithelial / HPF: NONE SEEN /[HPF] (ref <=5)
WBC, UA: NONE SEEN /[HPF] (ref 0–5)
pH: 7.5 (ref 5.0–8.0)

## 2023-10-15 LAB — C. TRACHOMATIS/N. GONORRHOEAE RNA
C. trachomatis RNA, TMA: NOT DETECTED
N. gonorrhoeae RNA, TMA: DETECTED — AB

## 2023-10-15 LAB — RPR: RPR Ser Ql: NONREACTIVE

## 2023-10-15 LAB — HEPATITIS B SURFACE ANTIBODY,QUALITATIVE: Hep B S Ab: NONREACTIVE

## 2023-10-15 LAB — HEPATITIS B SURFACE ANTIGEN: Hepatitis B Surface Ag: NONREACTIVE

## 2023-10-15 LAB — NO CULTURE INDICATED

## 2023-10-15 LAB — HIV ANTIBODY (ROUTINE TESTING W REFLEX): HIV 1&2 Ab, 4th Generation: NONREACTIVE

## 2023-10-15 LAB — HEPATITIS B CORE ANTIBODY, TOTAL: Hep B Core Total Ab: NONREACTIVE

## 2023-10-17 ENCOUNTER — Ambulatory Visit: Payer: BC Managed Care – PPO

## 2023-10-17 DIAGNOSIS — Z202 Contact with and (suspected) exposure to infections with a predominantly sexual mode of transmission: Secondary | ICD-10-CM

## 2023-10-17 DIAGNOSIS — A545 Gonococcal pharyngitis: Secondary | ICD-10-CM | POA: Diagnosis not present

## 2023-10-17 LAB — CHLAMYDIA/GC NAA, CONFIRMATION
Chlamydia trachomatis, NAA: NEGATIVE
Neisseria gonorrhoeae, NAA: NEGATIVE

## 2023-10-17 MED ORDER — CEFTRIAXONE SODIUM 1 G IJ SOLR
1.0000 g | Freq: Once | INTRAMUSCULAR | Status: AC
  Administered 2023-10-17: 1 g via INTRAMUSCULAR

## 2023-10-17 NOTE — Progress Notes (Signed)
Per orders of Dr Janee Morn, injection of Ceftriaxone 1 gram given in the LT buttocks given by Mickle Plumb, cma.  Patient tolerated injection well.   Return lab appointment scheduled for 10/31/23 @ 3:40 pm.  Dm/cma

## 2023-10-17 NOTE — Addendum Note (Signed)
Addended by: Fanny Bien B on: 10/17/2023 09:16 AM   Modules accepted: Orders

## 2023-10-31 ENCOUNTER — Other Ambulatory Visit: Payer: BC Managed Care – PPO

## 2023-10-31 DIAGNOSIS — A545 Gonococcal pharyngitis: Secondary | ICD-10-CM

## 2023-10-31 NOTE — Progress Notes (Signed)
Pt swabbed for GC/CT Probe, Amp (Throat) in lab. Advised patient once we get the results we will follow up in mychart or with a phone call. He verbalized understanding

## 2023-11-01 LAB — GC/CHLAMYDIA PROBE, AMP (THROAT)
Chlamydia trachomatis RNA: NOT DETECTED
Neisseria gonorrhoeae RNA: NOT DETECTED

## 2023-11-11 ENCOUNTER — Telehealth: Payer: Self-pay | Admitting: Family Medicine

## 2023-11-11 DIAGNOSIS — J4520 Mild intermittent asthma, uncomplicated: Secondary | ICD-10-CM

## 2023-11-11 MED ORDER — ALBUTEROL SULFATE HFA 108 (90 BASE) MCG/ACT IN AERS: 1.0000 | INHALATION_SPRAY | Freq: Four times a day (QID) | RESPIRATORY_TRACT | 0 refills | Status: DC | PRN

## 2023-11-11 NOTE — Telephone Encounter (Signed)
Pts nebulizer has stopped working and he has needed it. What does he need to do to get another?  [employer] [phone]

## 2023-11-11 NOTE — Telephone Encounter (Signed)
Patient is aware of annotation below and verbalized understanding. Pt asked about GC swab results. I advised him of results from 11/04/23 from Dr. Janee Morn "   Gonorrhea has been cleared.   ". He verbalized understanding.

## 2023-11-11 NOTE — Addendum Note (Signed)
Addended by: Fanny Bien B on: 11/11/2023 12:57 PM   Modules accepted: Orders

## 2023-11-21 ENCOUNTER — Telehealth: Payer: Self-pay | Admitting: Family Medicine

## 2023-11-21 ENCOUNTER — Ambulatory Visit: Payer: BC Managed Care – PPO | Admitting: Family Medicine

## 2023-11-21 NOTE — Telephone Encounter (Signed)
Pt did not show up for appt. Pls advise

## 2023-11-25 NOTE — Telephone Encounter (Signed)
11/21/2023 1st no show, letter via Earleen Reaper

## 2023-11-26 NOTE — Telephone Encounter (Signed)
Noted  

## 2023-11-27 LAB — GENITAL MYCOPLASMAS NAA, URINE

## 2023-11-27 LAB — HCV AB W REFLEX TO QUANT PCR: HCV Ab: NONREACTIVE

## 2023-11-27 LAB — HCV INTERPRETATION

## 2024-04-29 ENCOUNTER — Telehealth: Payer: Self-pay

## 2024-04-29 NOTE — Telephone Encounter (Signed)
 Forwarding message below.   LOV 10/14/2023 LRF 11/11/2023 RF #0  FOV none scheduled.

## 2024-04-29 NOTE — Telephone Encounter (Signed)
 Copied from CRM [id number]. Topic: Clinical - Order For Equipment >> Apr 29, 2024  1:33 PM Albertha Alosa wrote: Reason for CRM: Patient called in stated he has has a Nebulizer for a while and he has went out, wanted to know if there was anyway Dr.Thompson could put in a order for a new one

## 2024-05-01 NOTE — Telephone Encounter (Signed)
 Called Pt to schedule office visit for medication management and refills; left VM for call back.

## 2024-06-02 ENCOUNTER — Encounter: Payer: Self-pay | Admitting: Family Medicine

## 2024-06-02 ENCOUNTER — Ambulatory Visit: Payer: Self-pay | Admitting: Family Medicine

## 2024-06-02 VITALS — BP 122/80 | HR 80 | Temp 98.1°F | Ht 71.0 in | Wt 137.6 lb

## 2024-06-02 DIAGNOSIS — J302 Other seasonal allergic rhinitis: Secondary | ICD-10-CM

## 2024-06-02 DIAGNOSIS — J4520 Mild intermittent asthma, uncomplicated: Secondary | ICD-10-CM

## 2024-06-02 MED ORDER — ALBUTEROL SULFATE (2.5 MG/3ML) 0.083% IN NEBU: 2.5000 mg | INHALATION_SOLUTION | Freq: Four times a day (QID) | RESPIRATORY_TRACT | 1 refills | Status: AC | PRN

## 2024-06-02 MED ORDER — NEBULIZER SYSTEM ALL-IN-ONE MISC: 1.0000 | 0 refills | Status: DC | PRN

## 2024-06-02 MED ORDER — BUDESONIDE-FORMOTEROL FUMARATE 80-4.5 MCG/ACT IN AERO: 2.0000 | INHALATION_SPRAY | RESPIRATORY_TRACT | 3 refills | Status: AC | PRN

## 2024-06-02 NOTE — Patient Instructions (Addendum)
  VISIT SUMMARY: Today, you came in for a follow-up on your asthma management and to discuss replacing your nebulizer. We reviewed your current symptoms and triggers, and discussed potential changes to your treatment plan to better manage your asthma and seasonal allergies.  YOUR PLAN: -ASTHMA: Asthma is a condition where your airways narrow and swell, producing extra mucus, which can make breathing difficult. Your asthma is currently managed with an albuterol  inhaler, which you use about five times a week. We discussed the possibility of adding an inhaler with an inhaled steroid, such as Symbicort, to better control your symptoms if they increase. Symbicort combines budesonide and formoterol, which help to open your airways and reduce inflammation. You will continue using your albuterol  inhaler as needed, and we have prescribed Symbicort for use as an alternative if necessary. Additionally, a prescription for a new nebulizer machine has been sent.  -ALLERGIC RHINITIS: Allergic rhinitis is an allergic reaction that causes sneezing, runny nose, and other similar symptoms, usually triggered by allergens like pollen. Your symptoms are mild and primarily seasonal, and you are not currently using any antihistamines. Since your symptoms are not severe, no medication is required at this time.  INSTRUCTIONS: Please follow up with us  if your asthma symptoms increase or if you have any issues with your new medications or nebulizer. Continue to monitor your symptoms and use your medications as prescribed. If you experience any new or worsening symptoms, contact our office.

## 2024-06-02 NOTE — Progress Notes (Signed)
 Assessment & Plan   Assessment/Plan:   Assessment & Plan Asthma Intermittent asthma with occasional use of albuterol  inhaler, approximately five puffs per week. No recent exacerbations requiring hospitalization since childhood. No nocturnal symptoms, wheezing, cough, chest pain, or dyspnea reported. Symptoms primarily triggered by dust, exercise, and pollen exposure. Current management with albuterol  inhaler is adequate. Consideration for an inhaler with an inhaled steroid, such as Symbicort, is discussed for better control if symptoms increase. Symbicort combines budesonide and formoterol, providing both bronchodilation and anti-inflammatory effects, which may be beneficial if albuterol  use increases. - Prescribe albuterol  inhaler with refills for as-needed use. - Prescribe Symbicort (budesonide/formoterol) 80/4.5 mcg for as-needed use, two puffs every four hours, as an alternative to albuterol  inhaler. - Send prescription for a new nebulizer machine.  Seasonal allergies Chronic sneezing and occasional rhinorrhea, primarily seasonal and triggered by pollen. No significant symptoms such as conjunctivitis or nasal congestion reported. No current use of antihistamines like Allegra or Zyrtec. Symptoms are mild and do not currently require medication.      Medications Discontinued During This Encounter  Medication Reason   albuterol  (VENTOLIN  HFA) 108 (90 Base) MCG/ACT inhaler     Return if symptoms worsen or fail to improve.        Subjective:   Encounter date: 06/02/2024  Shawn Salas is a 18 y.o. male who has Peanut allergy; Lactose intolerance; Mild intermittent asthma; Seasonal allergies; History of nicotine vaping; Athletic heart syndrome; Screening for viral disease; Family history of heart disease; Routine sports physical exam; and Encounter for assessment of sexually transmitted disease exposure on their problem list..   He  has a past medical history of Asthma.SABRA   He  presents with chief complaint of Asthma (Needs new nebulizer machine and Rx for solution ) .   Discussed the use of AI scribe software for clinical note transcription with the patient, who gave verbal consent to proceed.  History of Present Illness Shawn Salas is a 18 year old male with asthma who presents for follow-up on asthma management and nebulizer replacement.  He discovered his nebulizer was non-functional during his first semester of college. The nebulizer is approximately 20 years old and has not been used frequently. He primarily uses an albuterol  inhaler as needed, approximately five puffs per week, mainly triggered by exposure to dust, pollen, or during physical activities such as running or mowing the grass.  He does not use a daily controller inhaler and has not experienced any nighttime awakenings due to asthma symptoms. His last hospital visit for an asthma exacerbation was when he was [age] or [age] years old.  No wheezing, cough, chest pain, or shortness of breath outside of the aforementioned triggers. He describes himself as a 'chronic sneezer' with occasional runny nose due to seasonal allergies but does not experience significant allergy symptoms such as red eyes or excessive nasal discharge. He does not take any over-the-counter allergy medications like Allegra or Zyrtec.  He recently had a cavity filled and received lidocaine , which has left his nose feeling numb. He works full-time at [employer] and plans to attend [employer]. He had an incident where he accidentally ran over his inhaler, requiring a replacement, which he managed to resolve at the pharmacy.     ROS  Past Surgical History:  Procedure Laterality Date   FINGER SURGERY  2020    Outpatient Medications Prior to Visit  Medication Sig Dispense Refill   albuterol  (VENTOLIN  HFA) 108 (90 Base) MCG/ACT inhaler Inhale  1-2 puffs into the lungs every 6 (six) hours as needed for wheezing or shortness  of breath. 153 g 0   No facility-administered medications prior to visit.    Family History  Problem Relation Age of Onset   Heart attack Father    Diabetes Paternal Grandmother     Social History   Socioeconomic History   Marital status: Single    Spouse name: Not on file   Number of children: Not on file   Years of education: Not on file   Highest education level: Not on file  Occupational History   Not on file  Tobacco Use   Smoking status: Never   Smokeless tobacco: Not on file  Vaping Use   Vaping status: Never Used  Substance and Sexual Activity   Alcohol use: No   Drug use: No   Sexual activity: Not on file  Other Topics Concern   Not on file  Social History Narrative   Not on file   Social Drivers of Health   Financial Resource Strain: Not on file  Food Insecurity: Not on file  Transportation Needs: Not on file  Physical Activity: Not on file  Stress: Not on file  Social Connections: Not on file  Intimate Partner Violence: Not on file                                                                                                  Objective:  Physical Exam: BP 122/80   Pulse 80   Temp 98.1 F (36.7 C) (Temporal)   Ht 5' 11 (1.803 m)   Wt 137 lb 9.6 oz (62.4 kg)   SpO2 97%   BMI 19.19 kg/m    Physical Exam GENERAL: Alert, cooperative, well developed, no acute distress HEENT: Normocephalic, normal oropharynx, moist mucous membranes CHEST: Clear to auscultation bilaterally, no wheezes, rhonchi, or crackles CARDIOVASCULAR: Normal heart rate and rhythm, S1 and S2 normal without murmurs ABDOMEN: Soft, non-tender, non-distended, without organomegaly, normal bowel sounds EXTREMITIES: No cyanosis or edema NEUROLOGICAL: Cranial nerves grossly intact, moves all extremities without gross motor or sensory deficit   Physical Exam  No results found.  No results found for this or any previous visit (from the past 2160 hours).      Beverley Adine Hummer, MD, MS

## 2024-06-05 ENCOUNTER — Telehealth: Payer: Self-pay

## 2024-06-05 NOTE — Telephone Encounter (Signed)
 Prescription Request  06/05/2024  LOV: Visit date not found  What is the name of the medication or equipment? Nebulizer System All-In-One MISC [[id number]]   Have you contacted your pharmacy to request a refill? Yes , they told her they never got the request for this script. The pharmacist also said they don't sell or have this in stock. She would like it sent to the pharmacy below.   Which pharmacy would you like this sent to?  [facility]  Address: [address] [place], [place] [address] Phone: [phone]  Patient notified that their request is being sent to the clinical staff for review and that they should receive a response within 2 business days.   Please advise at Mobile [phone] (mobile)         he is also wanting his scripts to be sent to [facility] Address: [address] [place], [place] [address] Phone: [phone] going forward.

## 2024-06-05 NOTE — Telephone Encounter (Signed)
 Called patient to inform Rx for Nebulizer System All-In-One MISC was sent to the pharmacy

## 2024-06-06 ENCOUNTER — Other Ambulatory Visit: Payer: Self-pay | Admitting: Family Medicine

## 2024-06-06 DIAGNOSIS — J4520 Mild intermittent asthma, uncomplicated: Secondary | ICD-10-CM

## 2024-06-08 MED ORDER — NEBULIZER SYSTEM ALL-IN-ONE MISC: 1.0000 | 0 refills | Status: AC | PRN

## 2024-06-08 NOTE — Addendum Note (Signed)
 Addended by: DWYANE HIM D on: 06/08/2024 11:20 AM   Modules accepted: Orders

## 2024-06-08 NOTE — Telephone Encounter (Signed)
 Nebulizer sent to [facility]

## 2024-09-06 ENCOUNTER — Other Ambulatory Visit: Payer: Self-pay | Admitting: Family Medicine

## 2024-09-06 DIAGNOSIS — J4520 Mild intermittent asthma, uncomplicated: Secondary | ICD-10-CM
